# Patient Record
Sex: Female | Born: 1955 | Race: White | Hispanic: No | Marital: Married | Smoking: Never smoker
Health system: Southern US, Community
[De-identification: ages and names within clinical notes are randomized; demographics above are authoritative.]

## PROBLEM LIST (undated history)

## (undated) DIAGNOSIS — E7800 Pure hypercholesterolemia, unspecified: Secondary | ICD-10-CM

## (undated) DIAGNOSIS — E119 Type 2 diabetes mellitus without complications: Secondary | ICD-10-CM

## (undated) DIAGNOSIS — I639 Cerebral infarction, unspecified: Secondary | ICD-10-CM

## (undated) HISTORY — PX: NECK SURGERY: SHX720

## (undated) HISTORY — PX: TUBAL LIGATION: SHX77

## (undated) HISTORY — PX: ROTATOR CUFF REPAIR: SHX139

---

## 2016-03-06 ENCOUNTER — Inpatient Hospital Stay: Admit: 2016-03-06 | Payer: Self-pay | Admitting: Orthopedic Surgery

## 2016-03-06 ENCOUNTER — Encounter (HOSPITAL_BASED_OUTPATIENT_CLINIC_OR_DEPARTMENT_OTHER): Payer: Self-pay | Admitting: *Deleted

## 2016-03-06 ENCOUNTER — Ambulatory Visit (HOSPITAL_BASED_OUTPATIENT_CLINIC_OR_DEPARTMENT_OTHER)
Admission: EM | Admit: 2016-03-06 | Discharge: 2016-03-07 | Disposition: A | Discharge status: Home / Self Care | Payer: Worker's Compensation | Attending: Emergency Medicine | Admitting: Emergency Medicine

## 2016-03-06 ENCOUNTER — Encounter (HOSPITAL_COMMUNITY)
Admission: EM | Disposition: A | Discharge status: Home / Self Care | Payer: Self-pay | Source: Home / Self Care | Attending: Emergency Medicine

## 2016-03-06 ENCOUNTER — Emergency Department (HOSPITAL_COMMUNITY): Payer: Worker's Compensation | Admitting: Anesthesiology

## 2016-03-06 ENCOUNTER — Emergency Department (HOSPITAL_BASED_OUTPATIENT_CLINIC_OR_DEPARTMENT_OTHER): Payer: Worker's Compensation

## 2016-03-06 ENCOUNTER — Emergency Department (HOSPITAL_COMMUNITY): Payer: Worker's Compensation

## 2016-03-06 DIAGNOSIS — W312XXA Contact with powered woodworking and forming machines, initial encounter: Secondary | ICD-10-CM | POA: Insufficient documentation

## 2016-03-06 DIAGNOSIS — E119 Type 2 diabetes mellitus without complications: Secondary | ICD-10-CM | POA: Diagnosis not present

## 2016-03-06 DIAGNOSIS — S68125A Partial traumatic metacarpophalangeal amputation of left ring finger, initial encounter: Secondary | ICD-10-CM | POA: Insufficient documentation

## 2016-03-06 DIAGNOSIS — S68123A Partial traumatic metacarpophalangeal amputation of left middle finger, initial encounter: Secondary | ICD-10-CM | POA: Insufficient documentation

## 2016-03-06 DIAGNOSIS — Z7984 Long term (current) use of oral hypoglycemic drugs: Secondary | ICD-10-CM | POA: Diagnosis not present

## 2016-03-06 DIAGNOSIS — E7800 Pure hypercholesterolemia, unspecified: Secondary | ICD-10-CM | POA: Diagnosis not present

## 2016-03-06 DIAGNOSIS — Y9389 Activity, other specified: Secondary | ICD-10-CM | POA: Insufficient documentation

## 2016-03-06 DIAGNOSIS — Z23 Encounter for immunization: Secondary | ICD-10-CM | POA: Diagnosis not present

## 2016-03-06 DIAGNOSIS — Z8673 Personal history of transient ischemic attack (TIA), and cerebral infarction without residual deficits: Secondary | ICD-10-CM | POA: Diagnosis not present

## 2016-03-06 DIAGNOSIS — Z79899 Other long term (current) drug therapy: Secondary | ICD-10-CM | POA: Insufficient documentation

## 2016-03-06 DIAGNOSIS — Y9289 Other specified places as the place of occurrence of the external cause: Secondary | ICD-10-CM | POA: Insufficient documentation

## 2016-03-06 DIAGNOSIS — I10 Essential (primary) hypertension: Secondary | ICD-10-CM | POA: Insufficient documentation

## 2016-03-06 DIAGNOSIS — S68111A Complete traumatic metacarpophalangeal amputation of left index finger, initial encounter: Secondary | ICD-10-CM | POA: Insufficient documentation

## 2016-03-06 DIAGNOSIS — Y990 Civilian activity done for income or pay: Secondary | ICD-10-CM | POA: Diagnosis not present

## 2016-03-06 DIAGNOSIS — S61402A Unspecified open wound of left hand, initial encounter: Secondary | ICD-10-CM

## 2016-03-06 DIAGNOSIS — S68113A Complete traumatic metacarpophalangeal amputation of left middle finger, initial encounter: Secondary | ICD-10-CM

## 2016-03-06 HISTORY — DX: Pure hypercholesterolemia, unspecified: E78.00

## 2016-03-06 HISTORY — DX: Cerebral infarction, unspecified: I63.9

## 2016-03-06 HISTORY — PX: AMPUTATION: SHX166

## 2016-03-06 HISTORY — DX: Type 2 diabetes mellitus without complications: E11.9

## 2016-03-06 LAB — BASIC METABOLIC PANEL
ANION GAP: 5 (ref 5–15)
BUN: 11 mg/dL (ref 6–20)
CO2: 25 mmol/L (ref 22–32)
Calcium: 8.9 mg/dL (ref 8.9–10.3)
Chloride: 111 mmol/L (ref 101–111)
Creatinine, Ser: 0.79 mg/dL (ref 0.44–1.00)
GFR calc Af Amer: >60 mL/min (ref >60)
Glucose, Bld: 114 mg/dL — ABNORMAL HIGH (ref 65–99)
POTASSIUM: 4.4 mmol/L (ref 3.5–5.1)
SODIUM: 141 mmol/L (ref 135–145)

## 2016-03-06 LAB — CBC
HCT: 33.5 % — ABNORMAL LOW (ref 36.0–46.0)
Hemoglobin: 10.7 g/dL — ABNORMAL LOW (ref 12.0–15.0)
MCH: 29.7 pg (ref 26.0–34.0)
MCHC: 31.9 g/dL (ref 30.0–36.0)
MCV: 93.1 fL (ref 78.0–100.0)
PLATELETS: 188 10*3/uL (ref 150–400)
RBC: 3.6 MIL/uL — AB (ref 3.87–5.11)
RDW: 12.4 % (ref 11.5–15.5)
WBC: 5.3 10*3/uL (ref 4.0–10.5)

## 2016-03-06 LAB — GLUCOSE, CAPILLARY
Glucose-Capillary: 114 mg/dL — ABNORMAL HIGH (ref 65–99)
Glucose-Capillary: 103 mg/dL — ABNORMAL HIGH (ref 65–99)
Glucose-Capillary: 196 mg/dL — ABNORMAL HIGH (ref 65–99)

## 2016-03-06 SURGERY — AMPUTATION DIGIT
Anesthesia: General | Site: Hand | Laterality: Left

## 2016-03-06 MED ORDER — FENTANYL CITRATE (PF) 250 MCG/5ML IJ SOLN
INTRAMUSCULAR | Status: AC
  Filled 2016-03-06: qty 5

## 2016-03-06 MED ORDER — PROPOFOL 10 MG/ML IV BOLUS
INTRAVENOUS | Status: DC | PRN
  Administered 2016-03-06: 150 mg via INTRAVENOUS

## 2016-03-06 MED ORDER — DOCUSATE SODIUM 100 MG PO CAPS
100.0000 mg | ORAL_CAPSULE | Freq: Two times a day (BID) | ORAL | Status: DC
Start: 2016-03-06 — End: 2016-03-07
  Administered 2016-03-06 – 2016-03-07 (×2): 100 mg via ORAL
  Filled 2016-03-06 (×2): qty 1

## 2016-03-06 MED ORDER — BUPIVACAINE HCL (PF) 0.25 % IJ SOLN
INTRAMUSCULAR | Status: DC | PRN
  Administered 2016-03-06: 10 mL

## 2016-03-06 MED ORDER — PROMETHAZINE HCL 25 MG/ML IJ SOLN: 6.2500 mg | INTRAMUSCULAR | Status: DC | PRN

## 2016-03-06 MED ORDER — DIPHENHYDRAMINE HCL 25 MG PO CAPS: 25.0000 mg | ORAL_CAPSULE | Freq: Four times a day (QID) | ORAL | Status: DC | PRN

## 2016-03-06 MED ORDER — HYDROMORPHONE HCL 1 MG/ML IJ SOLN: 0.5000 mg | INTRAMUSCULAR | Status: DC | PRN

## 2016-03-06 MED ORDER — FENTANYL CITRATE (PF) 100 MCG/2ML IJ SOLN
INTRAMUSCULAR | Status: DC | PRN
  Administered 2016-03-06 (×3): 50 ug via INTRAVENOUS

## 2016-03-06 MED ORDER — CHLORHEXIDINE GLUCONATE 4 % EX LIQD: 60.0000 mL | Freq: Once | CUTANEOUS | Status: DC

## 2016-03-06 MED ORDER — DOCUSATE SODIUM 100 MG PO CAPS: 100.0000 mg | ORAL_CAPSULE | Freq: Two times a day (BID) | ORAL | 0 refills | Status: AC

## 2016-03-06 MED ORDER — CEFAZOLIN IN D5W 1 GM/50ML IV SOLN
1.0000 g | Freq: Three times a day (TID) | INTRAVENOUS | Status: DC
  Administered 2016-03-07 (×3): 1 g via INTRAVENOUS
  Filled 2016-03-06 (×5): qty 50

## 2016-03-06 MED ORDER — METHOCARBAMOL 500 MG PO TABS: 500.0000 mg | ORAL_TABLET | Freq: Four times a day (QID) | ORAL | Status: DC | PRN

## 2016-03-06 MED ORDER — ONDANSETRON HCL 4 MG PO TABS: 4.0000 mg | ORAL_TABLET | Freq: Four times a day (QID) | ORAL | Status: DC | PRN

## 2016-03-06 MED ORDER — LIDOCAINE 2% (20 MG/ML) 5 ML SYRINGE
INTRAMUSCULAR | Status: AC
  Filled 2016-03-06: qty 5

## 2016-03-06 MED ORDER — LIDOCAINE HCL (CARDIAC) 20 MG/ML IV SOLN
INTRAVENOUS | Status: DC | PRN
  Administered 2016-03-06: 60 mg via INTRAVENOUS

## 2016-03-06 MED ORDER — SODIUM CHLORIDE 0.9 % IR SOLN
Status: DC | PRN
  Administered 2016-03-06: 3000 mL

## 2016-03-06 MED ORDER — MEPERIDINE HCL 25 MG/ML IJ SOLN: 6.2500 mg | INTRAMUSCULAR | Status: DC | PRN

## 2016-03-06 MED ORDER — CEFAZOLIN SODIUM-DEXTROSE 2-4 GM/100ML-% IV SOLN
2.0000 g | INTRAVENOUS | Status: AC
  Filled 2016-03-06: qty 100

## 2016-03-06 MED ORDER — MIDAZOLAM HCL 2 MG/2ML IJ SOLN
INTRAMUSCULAR | Status: AC
  Filled 2016-03-06: qty 2

## 2016-03-06 MED ORDER — MORPHINE SULFATE (PF) 4 MG/ML IV SOLN: 4.0000 mg | INTRAVENOUS | Status: DC | PRN

## 2016-03-06 MED ORDER — BUPIVACAINE HCL (PF) 0.25 % IJ SOLN
INTRAMUSCULAR | Status: AC
  Filled 2016-03-06: qty 30

## 2016-03-06 MED ORDER — HYDROMORPHONE HCL 1 MG/ML IJ SOLN
INTRAMUSCULAR | Status: AC
  Filled 2016-03-06: qty 1

## 2016-03-06 MED ORDER — ONDANSETRON HCL 4 MG/2ML IJ SOLN: 4.0000 mg | Freq: Four times a day (QID) | INTRAMUSCULAR | Status: DC | PRN

## 2016-03-06 MED ORDER — EPHEDRINE SULFATE 50 MG/ML IJ SOLN
INTRAMUSCULAR | Status: DC | PRN
  Administered 2016-03-06: 20 mg via INTRAVENOUS

## 2016-03-06 MED ORDER — ENALAPRIL MALEATE 2.5 MG PO TABS
2.5000 mg | ORAL_TABLET | Freq: Every day | ORAL | Status: DC
  Administered 2016-03-07: 2.5 mg via ORAL
  Filled 2016-03-06 (×2): qty 1

## 2016-03-06 MED ORDER — HYDROCODONE-ACETAMINOPHEN 7.5-325 MG PO TABS
1.0000 | ORAL_TABLET | ORAL | Status: DC | PRN
  Administered 2016-03-06 – 2016-03-07 (×3): 1 via ORAL
  Filled 2016-03-06 (×3): qty 1

## 2016-03-06 MED ORDER — LACTATED RINGERS IV SOLN: INTRAVENOUS | Status: DC

## 2016-03-06 MED ORDER — TETANUS-DIPHTH-ACELL PERTUSSIS 5-2.5-18.5 LF-MCG/0.5 IM SUSP
0.5000 mL | Freq: Once | INTRAMUSCULAR | Status: AC
  Administered 2016-03-06: 0.5 mL via INTRAMUSCULAR
  Filled 2016-03-06: qty 0.5

## 2016-03-06 MED ORDER — LACTATED RINGERS IV SOLN
INTRAVENOUS | Status: DC | PRN
  Administered 2016-03-06: 17:00:00 via INTRAVENOUS

## 2016-03-06 MED ORDER — OXYCODONE-ACETAMINOPHEN 5-325 MG PO TABS
1.0000 | ORAL_TABLET | ORAL | Status: DC | PRN
  Administered 2016-03-07: 1 via ORAL
  Filled 2016-03-06: qty 1

## 2016-03-06 MED ORDER — CEPHALEXIN 500 MG PO CAPS: 500.0000 mg | ORAL_CAPSULE | Freq: Four times a day (QID) | ORAL | 0 refills | Status: AC

## 2016-03-06 MED ORDER — ONDANSETRON HCL 4 MG/2ML IJ SOLN
INTRAMUSCULAR | Status: DC | PRN
Start: 2016-03-06 — End: 2016-03-06
  Administered 2016-03-06: 4 mg via INTRAVENOUS

## 2016-03-06 MED ORDER — MIDAZOLAM HCL 5 MG/5ML IJ SOLN
INTRAMUSCULAR | Status: DC | PRN
  Administered 2016-03-06: 2 mg via INTRAVENOUS

## 2016-03-06 MED ORDER — KCL IN DEXTROSE-NACL 20-5-0.45 MEQ/L-%-% IV SOLN
INTRAVENOUS | Status: AC
  Filled 2016-03-06: qty 1000

## 2016-03-06 MED ORDER — METFORMIN HCL 500 MG PO TABS
1000.0000 mg | ORAL_TABLET | Freq: Two times a day (BID) | ORAL | Status: DC
  Administered 2016-03-07: 1000 mg via ORAL
  Filled 2016-03-06: qty 2

## 2016-03-06 MED ORDER — PROPOFOL 10 MG/ML IV BOLUS
INTRAVENOUS | Status: AC
  Filled 2016-03-06: qty 20

## 2016-03-06 MED ORDER — VITAMIN C 500 MG PO TABS
1000.0000 mg | ORAL_TABLET | Freq: Every day | ORAL | Status: DC
  Administered 2016-03-06 – 2016-03-07 (×2): 1000 mg via ORAL
  Filled 2016-03-06 (×2): qty 2

## 2016-03-06 MED ORDER — ONDANSETRON HCL 4 MG/2ML IJ SOLN
INTRAMUSCULAR | Status: AC
  Filled 2016-03-06: qty 2

## 2016-03-06 MED ORDER — GENTAMICIN SULFATE 40 MG/ML IJ SOLN
390.0000 mg | INTRAVENOUS | Status: AC
  Administered 2016-03-06: 390 mg via INTRAVENOUS
  Filled 2016-03-06: qty 9.75

## 2016-03-06 MED ORDER — KCL IN DEXTROSE-NACL 20-5-0.45 MEQ/L-%-% IV SOLN
INTRAVENOUS | Status: DC
  Administered 2016-03-06: 19:00:00 via INTRAVENOUS

## 2016-03-06 MED ORDER — HYDROMORPHONE HCL 1 MG/ML IJ SOLN
0.2500 mg | INTRAMUSCULAR | Status: DC | PRN
  Administered 2016-03-06: 0.5 mg via INTRAVENOUS

## 2016-03-06 MED ORDER — CEFAZOLIN IN D5W 1 GM/50ML IV SOLN
1.0000 g | Freq: Once | INTRAVENOUS | Status: AC
  Administered 2016-03-06: 1 g via INTRAVENOUS
  Filled 2016-03-06: qty 50

## 2016-03-06 MED ORDER — ONDANSETRON HCL 4 MG/2ML IJ SOLN
4.0000 mg | Freq: Once | INTRAMUSCULAR | Status: AC
  Administered 2016-03-06: 4 mg via INTRAVENOUS
  Filled 2016-03-06: qty 2

## 2016-03-06 MED ORDER — MORPHINE SULFATE (PF) 4 MG/ML IV SOLN
4.0000 mg | Freq: Once | INTRAVENOUS | Status: AC
  Administered 2016-03-06: 4 mg via INTRAVENOUS
  Filled 2016-03-06: qty 1

## 2016-03-06 MED ORDER — OXYCODONE-ACETAMINOPHEN 5-325 MG PO TABS: 1.0000 | ORAL_TABLET | ORAL | 0 refills | Status: AC | PRN

## 2016-03-06 MED ORDER — CEFAZOLIN IN D5W 1 GM/50ML IV SOLN
1.0000 g | INTRAVENOUS | Status: AC
  Administered 2016-03-06: 1 g via INTRAVENOUS
  Filled 2016-03-06: qty 50

## 2016-03-06 MED ORDER — ATORVASTATIN CALCIUM 40 MG PO TABS: 40.0000 mg | ORAL_TABLET | Freq: Every day | ORAL | Status: DC

## 2016-03-06 MED ORDER — METHOCARBAMOL 1000 MG/10ML IJ SOLN
500.0000 mg | Freq: Four times a day (QID) | INTRAVENOUS | Status: DC | PRN
  Filled 2016-03-06: qty 5

## 2016-03-06 MED ORDER — LACTATED RINGERS IV SOLN
INTRAVENOUS | Status: DC
  Administered 2016-03-06: 15:00:00 via INTRAVENOUS

## 2016-03-06 MED ORDER — 0.9 % SODIUM CHLORIDE (POUR BTL) OPTIME
TOPICAL | Status: DC | PRN
  Administered 2016-03-06: 1000 mL

## 2016-03-06 SURGICAL SUPPLY — 50 items
BANDAGE ACE 4X5 VEL STRL LF (GAUZE/BANDAGES/DRESSINGS) ×2 IMPLANT
BANDAGE ELASTIC 3 VELCRO ST LF (GAUZE/BANDAGES/DRESSINGS) IMPLANT
BANDAGE ELASTIC 4 VELCRO ST LF (GAUZE/BANDAGES/DRESSINGS) IMPLANT
BLADE 15 SAFETY STRL DISP (BLADE) ×6 IMPLANT
BLADE SAW SGTL 83.5X18.5 (BLADE) ×2 IMPLANT
BNDG COHESIVE 1X5 TAN STRL LF (GAUZE/BANDAGES/DRESSINGS) ×2 IMPLANT
BNDG CONFORM 2 STRL LF (GAUZE/BANDAGES/DRESSINGS) IMPLANT
BNDG ELASTIC 2X5.8 VLCR STR LF (GAUZE/BANDAGES/DRESSINGS) ×2 IMPLANT
BNDG GAUZE ELAST 4 BULKY (GAUZE/BANDAGES/DRESSINGS) ×4 IMPLANT
CORDS BIPOLAR (ELECTRODE) ×2 IMPLANT
COVER SURGICAL LIGHT HANDLE (MISCELLANEOUS) ×2 IMPLANT
CUFF TOURNIQUET SINGLE 18IN (TOURNIQUET CUFF) ×2 IMPLANT
CUFF TOURNIQUET SINGLE 24IN (TOURNIQUET CUFF) IMPLANT
DRAPE SURG 17X23 STRL (DRAPES) ×2 IMPLANT
DRSG ADAPTIC 3X8 NADH LF (GAUZE/BANDAGES/DRESSINGS) ×2 IMPLANT
DRSG PAD ABDOMINAL 8X10 ST (GAUZE/BANDAGES/DRESSINGS) ×2 IMPLANT
GAUZE SPONGE 2X2 8PLY STRL LF (GAUZE/BANDAGES/DRESSINGS) IMPLANT
GAUZE SPONGE 4X4 12PLY STRL (GAUZE/BANDAGES/DRESSINGS) ×2 IMPLANT
GLOVE BIOGEL PI IND STRL 8.5 (GLOVE) ×1 IMPLANT
GLOVE BIOGEL PI INDICATOR 8.5 (GLOVE) ×1
GLOVE SURG ORTHO 8.0 STRL STRW (GLOVE) ×4 IMPLANT
GOWN STRL REUS W/ TWL LRG LVL3 (GOWN DISPOSABLE) ×1 IMPLANT
GOWN STRL REUS W/ TWL XL LVL3 (GOWN DISPOSABLE) ×1 IMPLANT
GOWN STRL REUS W/TWL LRG LVL3 (GOWN DISPOSABLE) ×1
GOWN STRL REUS W/TWL XL LVL3 (GOWN DISPOSABLE) ×1
KIT BASIN OR (CUSTOM PROCEDURE TRAY) ×2 IMPLANT
KIT ROOM TURNOVER OR (KITS) ×2 IMPLANT
MANIFOLD NEPTUNE II (INSTRUMENTS) ×2 IMPLANT
NEEDLE HYPO 25GX1X1/2 BEV (NEEDLE) ×2 IMPLANT
NS IRRIG 1000ML POUR BTL (IV SOLUTION) ×2 IMPLANT
PACK ORTHO EXTREMITY (CUSTOM PROCEDURE TRAY) ×2 IMPLANT
PAD ARMBOARD 7.5X6 YLW CONV (MISCELLANEOUS) ×4 IMPLANT
PAD CAST 4YDX4 CTTN HI CHSV (CAST SUPPLIES) ×1 IMPLANT
PADDING CAST COTTON 4X4 STRL (CAST SUPPLIES) ×1
SOAP 2 % CHG 4 OZ (WOUND CARE) ×2 IMPLANT
SPECIMEN JAR SMALL (MISCELLANEOUS) ×2 IMPLANT
SPLINT FIBERGLASS 3X12 (CAST SUPPLIES) ×2 IMPLANT
SPONGE GAUZE 2X2 STER 10/PKG (GAUZE/BANDAGES/DRESSINGS)
SPONGE GAUZE 4X4 12PLY STER LF (GAUZE/BANDAGES/DRESSINGS) ×2 IMPLANT
SUCTION FRAZIER HANDLE 10FR (MISCELLANEOUS)
SUCTION TUBE FRAZIER 10FR DISP (MISCELLANEOUS) IMPLANT
SUT ETHIBOND 4 0 TF (SUTURE) ×2 IMPLANT
SUT MERSILENE 4 0 P 3 (SUTURE) IMPLANT
SUT PROLENE 4 0 PS 2 18 (SUTURE) ×12 IMPLANT
SYR CONTROL 10ML LL (SYRINGE) ×2 IMPLANT
SYSTEM CHEST DRAIN TLS 7FR (DRAIN) ×2 IMPLANT
TOWEL OR 17X24 6PK STRL BLUE (TOWEL DISPOSABLE) ×2 IMPLANT
TOWEL OR 17X26 10 PK STRL BLUE (TOWEL DISPOSABLE) ×2 IMPLANT
TUBE CONNECTING 12X1/4 (SUCTIONS) IMPLANT
WATER STERILE IRR 1000ML POUR (IV SOLUTION) ×2 IMPLANT

## 2016-03-06 NOTE — ED Notes (Signed)
Attempt x 2 to start IV to right arm. Unsuccessful.

## 2016-03-06 NOTE — ED Provider Notes (Signed)
Patient arrives from [facility] to see Dr. Melvyn Novas for amputations of fingers of left hand.  She states her pain is controlled currently.  She already received a tetanus vaccine.  Will page Dr. Melvyn Novas and notify of patient's arrival.  1:54 PM Spoke with Dr. Melvyn Novas.  Placing orders to take to OR.  Requested ancef and gent which has been ordered.  Will remain NPO.  3:08 PM Patient up to OR at this time.   Garlon Hatchet, PA-C 03/06/16 1508    Azalia Bilis, MD 03/06/16 2300

## 2016-03-06 NOTE — Anesthesia Preprocedure Evaluation (Addendum)
Anesthesia Evaluation  Patient identified by MRN, date of birth, ID band Patient awake    Reviewed: Allergy & Precautions, NPO status , Patient's Chart, lab work & pertinent test results  Airway Mallampati: I  TM Distance: >3 FB Neck ROM: Full    Dental  (+) Edentulous Upper, Edentulous Lower   Pulmonary neg pulmonary ROS,    breath sounds clear to auscultation       Cardiovascular hypertension, Pt. on medications negative cardio ROS   Rhythm:Regular Rate:Normal     Neuro/Psych CVA negative psych ROS   GI/Hepatic negative GI ROS, Neg liver ROS,   Endo/Other  diabetes, Type 2, Oral Hypoglycemic Agents  Renal/GU negative Renal ROS  negative genitourinary   Musculoskeletal negative musculoskeletal ROS (+)   Abdominal   Peds negative pediatric ROS (+)  Hematology negative hematology ROS (+)   Anesthesia Other Findings - HLD  Reproductive/Obstetrics negative OB ROS                            Lab Results  Component Value Date   WBC 5.3 03/06/2016   HGB 10.7 (L) 03/06/2016   HCT 33.5 (L) 03/06/2016   MCV 93.1 03/06/2016   PLT 188 03/06/2016   No results found for: CREATININE, BUN, NA, K, CL, CO2 No results found for: INR, PROTIME  02/2016 EKG: normal EKG, normal sinus rhythm.   Anesthesia Physical Anesthesia Plan  ASA: II and emergent  Anesthesia Plan: General   Post-op Pain Management:    Induction: Intravenous  Airway Management Planned: Oral ETT  Additional Equipment:   Intra-op Plan:   Post-operative Plan: Extubation in OR  Informed Consent: I have reviewed the patients History and Physical, chart, labs and discussed the procedure including the risks, benefits and alternatives for the proposed anesthesia with the patient or authorized representative who has indicated his/her understanding and acceptance.   Dental advisory given  Plan Discussed with:  CRNA  Anesthesia Plan Comments: (Ms. Denholm refused nerve block. She states "pain is not that bad")       Anesthesia Quick Evaluation

## 2016-03-06 NOTE — ED Notes (Signed)
[facility] here to transport pt to [facility] ED.

## 2016-03-06 NOTE — ED Notes (Signed)
Pt returned from xray

## 2016-03-06 NOTE — ED Provider Notes (Signed)
EKG Interpretation  Date/Time:  Wednesday March 06 2016 14:31:26 EDT Ventricular Rate:  62 PR Interval:    QRS Duration: 80 QT Interval:  429 QTC Calculation: 436 R Axis:   12 Text Interpretation:  Sinus rhythm Low voltage, precordial leads No old tracing to compare Confirmed by Halley Kincer  MD, Caryn Bee ([id number]) on 03/06/2016 2:34:13 PM      Preoperative labs, EKG, chest x-ray obtained.  Tetanus RE updated.  Ancef given.  Patient comfortable this time.  Pain medications offered.  Patient will remain nothing by mouth in preparation for operative repair by orthopedic hand surgery   Azalia Bilis, MD 03/06/16 1434

## 2016-03-06 NOTE — Transfer of Care (Signed)
Immediate Anesthesia Transfer of Care Note  Patient: Mary Tran  Procedure(s) Performed: Procedure(s): LEFT INDEX FINGER AND LONG FINGER REVISION OF AMPUTATION (Left)  Patient Location: PACU  Anesthesia Type:General  Level of Consciousness: awake  Airway & Oxygen Therapy: Patient Spontanous Breathing and Patient connected to nasal cannula oxygen  Post-op Assessment: Report given to RN and Post -op Vital signs reviewed and stable  Post vital signs: Reviewed and stable  Last Vitals:  Vitals:   03/06/16 1200 03/06/16 1312  BP: 119/66 133/55  Pulse: 64 68  Resp:  17  Temp:  36.4 C    Last Pain:  Vitals:   03/06/16 1313  TempSrc:   PainSc: 3          Complications: No apparent anesthesia complications

## 2016-03-06 NOTE — ED Notes (Signed)
Patient arrived via [facility] already in gown, placed on continuous pulse oximetry and blood pressure cuff

## 2016-03-06 NOTE — ED Notes (Signed)
Patient transported to X-ray 

## 2016-03-06 NOTE — Brief Op Note (Signed)
03/06/2016  4:48 PM  PATIENT:  Mary Tran  60 y.o. female  PRE-OPERATIVE DIAGNOSIS:  Amputation of finger  POST-OPERATIVE DIAGNOSIS:  Amputation of finger  PROCEDURE:  Procedure(s): LEFT INDEX FINGER AND LONG FINGER REVISION OF AMPUTATION (Left)  SURGEON:  Surgeon(s) and Role:    * Bradly Bienenstock, MD - Primary  PHYSICIAN ASSISTANT:   ASSISTANTS: none   ANESTHESIA:   general  EBL:  No intake/output data recorded.  BLOOD ADMINISTERED:none  DRAINS: none   LOCAL MEDICATIONS USED:  MARCAINE     SPECIMEN:  No Specimen  DISPOSITION OF SPECIMEN:  N/A  COUNTS:  YES  TOURNIQUET:    DICTATION: .Other Dictation: Dictation Number [id number]  PLAN OF CARE: Admit for overnight observation  PATIENT DISPOSITION:  PACU - hemodynamically stable.   Delay start of Pharmacological VTE agent (>24hrs) due to surgical blood loss or risk of bleeding: not applicable [id number]

## 2016-03-06 NOTE — ED Notes (Signed)
Pt wound unwrapped per order of Dr Particia Nearing to take a picture for trauma surgeon. Bleeding controlled.  Hand rewrapped with saline soaked gauge and secured with kerlex.

## 2016-03-06 NOTE — ED Notes (Signed)
Report given to Administrator, Civil Service at [facility] ED.

## 2016-03-06 NOTE — ED Triage Notes (Signed)
Pt transferred via [facility] from [facility] for hand surgery consult. Around 0850 this morning pt was using a sander and got her left hand caught in the machine amputating her left index and left middle finger. Dressing dry and intact at this time. Pt has feeling in the other fingers of the left hand and cap refill is less than three seconds. Radial pulses equal, strong bilaterally. Pt reports pain 3/10 at this time.

## 2016-03-06 NOTE — ED Notes (Signed)
Pt wound cleaned with sterile saline. Saline soaked gauze applied to wound with bulky kerlex drsg to secure it. Pt tolerated well.

## 2016-03-06 NOTE — H&P (Signed)
Mary Tran is an 60 y.o. female.   Chief Complaint: Left hand index/long finger amputations HPI: Pt at worked sustained degloving injury to the left hand Pt was at work today and her L hand became caught in a sander.  She has an amputation of her left index and middle finger.  There is a partial degloving of the dorsum of her left hand.  Pt is able to feel and move her thumb, ring and small finger.  Pt is right hand dominant.  Past Medical History:  Diagnosis Date  . Diabetes mellitus without complication (HCC)   . Hypercholesteremia   . Stroke [facility])     Past Surgical History:  Procedure Laterality Date  . NECK SURGERY    . ROTATOR CUFF REPAIR    . TUBAL LIGATION      History reviewed. No pertinent family history. Social History:  reports that she has never smoked. She has never used smokeless tobacco. She reports that she does not drink alcohol or use drugs.  Allergies: No Known Allergies    No results found for this or any previous visit (from the past 48 hour(s)). Dg Hand Complete Left  Result Date: 03/06/2016 CLINICAL DATA:  Injury. EXAM: LEFT HAND - COMPLETE 3+ VIEW COMPARISON:  No prior. FINDINGS: Amputation of the left second and third digits noted. Amputation is at the level of the metacarpal phalangeal joints. Comminuted fracture fragments are noted. Tiny metallic soft tissue foreign bodies cannot be exclude. Diffuse degenerative change. IMPRESSION: Amputation of the left second and third digits as described above. Electronically Signed   By: Maisie Fus  Register   On: 03/06/2016 09:39   ROS:NO RECENT ILLNESSES OR HOSPITALIZATIONS  Blood pressure 133/55, pulse 68, temperature 97.6 F (36.4 C), temperature source Oral, resp. rate 17, height 5\' 5"  (1.651 m), weight 77.1 kg (170 lb), SpO2 99 %. Physical Exam  General Appearance:  Alert, cooperative, no distress, appears stated age  Head:  Normocephalic, without obvious abnormality, atraumatic  Eyes:  Pupils equal,  conjunctiva/corneas clear,         Throat: Lips, mucosa, and tongue normal; teeth and gums normal  Neck: No visible masses     Lungs:   respirations unlabored  Chest Wall:  No tenderness or deformity  Heart:  Regular rate and rhythm,  Abdomen:   Soft, non-tender,         Extremities: LUE: DRESSING IN PLACE, IMAGES REVIEWED GOOD THUMB AND RING/SMALL FINGER MOTION ABLE TO FLEX AND EXTEND WRIST GOOD BLOOD FLOW TO RING AND SMALL AND THUMB  Pulses: 2+ and symmetric  Skin: Skin color, texture, turgor normal, no rashes or lesions     Neurologic: Normal    Assessment/Plan LEFT INDEX AND LONG FINGER DEGLOVING AMPUTATIONS THROUGH MP JOINT  PT WITH NON-REPLANTABLE DIGITS, ONLY HAS ONE FINGER WITH HER. AVULSION TYPE INJURY WITH +RIBBON SIGNS TO TENDON AND SOFT TISSUE INJURY TO FINGER BROUGHT IN. POOR VOLAR AND DORSAL VENOUS PLEXUS FOR REPLANTATION  PT UNDERSTANDS REASON AND RATIONALE FOR REVISION AMPUTATION AND WOUND CLOSURE AND DEBRIDEMENT  R/B/A DISCUSSED WITH PT IN HOSPITAL.  PT VOICED UNDERSTANDING OF PLAN CONSENT SIGNED DAY OF SURGERY PT SEEN AND EXAMINED PRIOR TO OPERATIVE PROCEDURE/DAY OF SURGERY SITE MARKED. QUESTIONS ANSWERED WILL BE ADMITTED FOLLOWING SURGERY  WE ARE PLANNING SURGERY FOR YOUR UPPER EXTREMITY. THE RISKS AND BENEFITS OF SURGERY INCLUDE BUT NOT LIMITED TO BLEEDING INFECTION, DAMAGE TO NEARBY NERVES ARTERIES TENDONS, FAILURE OF SURGERY TO ACCOMPLISH ITS INTENDED GOALS, PERSISTENT SYMPTOMS AND NEED FOR FURTHER  SURGICAL INTERVENTION. WITH THIS IN MIND WE WILL PROCEED. I HAVE DISCUSSED WITH THE PATIENT THE PRE AND POSTOPERATIVE REGIMEN AND THE DOS AND DON'TS. PT VOICED UNDERSTANDING AND INFORMED CONSENT SIGNED.  Sharma Covert 03/06/2016, 2:21 PM

## 2016-03-06 NOTE — ED Triage Notes (Signed)
Pt states she got left hand caught in sander. Pt is missing index and middle finger of left hand and degloving of back of hand. NL pulses. Bleeding controlled.

## 2016-03-06 NOTE — ED Notes (Signed)
Son called per pt request. Lexine Baton [phone]. Updated on pt condition. Gave son phone number and address to ED.

## 2016-03-06 NOTE — ED Notes (Signed)
Report given to Paula RN with [facility]. 

## 2016-03-06 NOTE — ED Provider Notes (Signed)
[facility] Provider Note   CSN: [id number] Arrival date & time: 03/06/16  9983  First Provider Contact:  First MD Initiated Contact with Patient 03/06/16 0848        History   Chief Complaint Chief Complaint  Patient presents with  . Hand Injury    HPI Mary Tran is a 60 y.o. female.  Pt was at work today and her L hand became caught in a sander.  She has an amputation of her left index and middle finger.  There is a partial degloving of the dorsum of her left hand.  Pt is able to feel and move her thumb, ring and small finger.  Pt is right hand dominant.   The history is provided by the patient.    Past Medical History:  Diagnosis Date  . Diabetes mellitus without complication (HCC)   . Hypercholesteremia   . Stroke [facility])     There are no active problems to display for this patient.   Past Surgical History:  Procedure Laterality Date  . NECK SURGERY    . ROTATOR CUFF REPAIR    . TUBAL LIGATION      OB History    No data available       Home Medications    Prior to Admission medications   Medication Sig Start Date End Date Taking? Authorizing Provider  metFORMIN (GLUCOPHAGE) 1000 MG tablet Take 1,000 mg by mouth 2 (two) times daily with a meal.   Yes Historical Provider, MD  simvastatin (ZOCOR) 80 MG tablet Take 80 mg by mouth daily.   Yes Historical Provider, MD    Family History No family history on file.  Social History Social History  Substance Use Topics  . Smoking status: Never Smoker  . Smokeless tobacco: Never Used  . Alcohol use Not on file     Allergies   Review of patient's allergies indicates no known allergies.   Review of Systems Review of Systems  Musculoskeletal:       Right hand pain  All other systems reviewed and are negative.    Physical Exam Updated Vital Signs BP 120/66   Pulse 69   Temp 98.5 F (36.9 C) (Oral)   Resp 18   Ht 5\' 5"  (1.651 m)   Wt 170 lb (77.1 kg)   SpO2 100%   BMI 28.29  kg/m   Physical Exam  Constitutional: She is oriented to person, place, and time. She appears well-developed and well-nourished.  HENT:  Head: Normocephalic and atraumatic.  Right Ear: External ear normal.  Left Ear: External ear normal.  Nose: Nose normal.  Mouth/Throat: Oropharynx is clear and moist.  Eyes: Conjunctivae and EOM are normal. Pupils are equal, round, and reactive to light.  Neck: Normal range of motion. Neck supple.  Cardiovascular: Normal rate, regular rhythm, normal heart sounds and intact distal pulses.   Pulmonary/Chest: Effort normal and breath sounds normal.  Abdominal: Soft. Bowel sounds are normal.  Musculoskeletal:  Traumatic amputation of index and middle finger.  Skin on dorsum of hand partially gone.  Good movement of thumb and ring/small finger.  Palmar surface of hand looks ok.  Neurological: She is alert and oriented to person, place, and time.  Skin: Skin is warm and dry.  Psychiatric: She has a normal mood and affect. Her behavior is normal. Judgment and thought content normal.  Nursing note and vitals reviewed.    ED Treatments / Results  Labs (all labs ordered are listed, but only  abnormal results are displayed) Labs Reviewed - No data to display  EKG  EKG Interpretation None       Radiology Dg Hand Complete Left  Result Date: 03/06/2016 CLINICAL DATA:  Injury. EXAM: LEFT HAND - COMPLETE 3+ VIEW COMPARISON:  No prior. FINDINGS: Amputation of the left second and third digits noted. Amputation is at the level of the metacarpal phalangeal joints. Comminuted fracture fragments are noted. Tiny metallic soft tissue foreign bodies cannot be exclude. Diffuse degenerative change. IMPRESSION: Amputation of the left second and third digits as described above. Electronically Signed   By: Maisie Fus  Register   On: 03/06/2016 09:39   Procedures .Critical Care Performed by: Jacalyn Lefevre Authorized by: Jacalyn Lefevre   Critical care provider  statement:    Critical care time (minutes):  30   Critical care was necessary to treat or prevent imminent or life-threatening deterioration of the following conditions:  Trauma   Critical care was time spent personally by me on the following activities:  Development of treatment plan with patient or surrogate, discussions with consultants, examination of patient, ordering and performing treatments and interventions, ordering and review of radiographic studies and re-evaluation of patient's condition    (including critical care time)  Medications Ordered in ED Medications  ceFAZolin (ANCEF) IVPB 1 g/50 mL premix (0 g Intravenous Stopped 03/06/16 0940)  ondansetron (ZOFRAN) injection 4 mg (4 mg Intravenous Given 03/06/16 0925)  morphine 4 MG/ML injection 4 mg (4 mg Intravenous Given 03/06/16 0925)     Initial Impression / Assessment and Plan / ED Course  I have reviewed the triage vital signs and the nursing notes.  Pertinent labs & imaging results that were available during my care of the patient were reviewed by me and considered in my medical decision making (see chart for details).  Clinical Course  Pt's wedding rings were removed with ring cutters prior to x-ray. Initially, we did not have any of her fingers.  However, someone from her job brought 1 of her fingers.  We wrapped it in a saline soaked gauze and indirectly cooled.  Pt d/w Dr. Melvyn Novas (on call for hand) and he does not do reimplantations.  He recommended transfer to [facility].  Pt d/w Dr. Tamela Oddi (Plastics and on call for hand) and he said that the likelihood of a successful reimplantation is very low, and that our hand surgeon can take care of pt.  He would not accept pt unless our hand evaluated pt and called him.  Pt d/w Dr. Melvyn Novas again and asked for pictures.  I sent him pictures and he does not think the hand is suitable for reimplantation.  He said that he was willing to call the plastics dr at [facility] if pt was  wanting to try to go that route.  Pt decided that she does not and agrees to go to [facility].  Dr. Melvyn Novas requested that pt go to the ED at [facility].  I spoke with Dr. Juleen China (ED) who will call Dr. Melvyn Novas when pt arrives.   Final Clinical Impressions(s) / ED Diagnoses   Final diagnoses:  Traumatic amputation of second finger of left hand, initial encounter  Traumatic amputation of third finger of left hand, initial encounter  Degloving injury of left hand, initial encounter    New Prescriptions New Prescriptions   No medications on file     Jacalyn Lefevre, MD 03/08/16 1554

## 2016-03-06 NOTE — Discharge Instructions (Signed)
KEEP BANDAGE CLEAN AND DRY °CALL OFFICE FOR F/U APPT [phone] IN 7 DAYS °KEEP HAND ELEVATED ABOVE HEART °OK TO APPLY ICE TO OPERATIVE AREA °CONTACT OFFICE IF ANY WORSENING PAIN OR CONCERNS. °

## 2016-03-06 NOTE — Anesthesia Procedure Notes (Signed)
Procedure Name: LMA Insertion Date/Time: 03/06/2016 4:55 PM Performed by: Gavin Pound, Shalik Sanfilippo J Pre-anesthesia Checklist: Patient identified, Emergency Drugs available, Suction available, Patient being monitored and Timeout performed Patient Re-evaluated:Patient Re-evaluated prior to inductionOxygen Delivery Method: Circle system utilized Preoxygenation: Pre-oxygenation with 100% oxygen Intubation Type: IV induction Ventilation: Mask ventilation without difficulty LMA: LMA inserted LMA Size: 4.0 Number of attempts: 1 Placement Confirmation: positive ETCO2 and breath sounds checked- equal and bilateral Tube secured with: Tape Dental Injury: Teeth and Oropharynx as per pre-operative assessment

## 2016-03-07 ENCOUNTER — Encounter (HOSPITAL_COMMUNITY): Payer: Self-pay | Admitting: Orthopedic Surgery

## 2016-03-07 LAB — GLUCOSE, CAPILLARY: Glucose-Capillary: 144 mg/dL — ABNORMAL HIGH (ref 65–99)

## 2016-03-07 NOTE — Progress Notes (Signed)
Pt ready for discharge. Education/instructions reviewed with pt and family, and all questions/conerns addressed. IV removed and belongings gathered. Pt will be transported out via wheelchair to husband's car. Will continue to monitor

## 2016-03-07 NOTE — Op Note (Signed)
NAMEMarland Kitchen  KIMIYAH, BLICK NO.:  [id number]  MEDICAL RECORD NO.:  [id number]  LOCATION:  [id number]                        FACILITY:  [facility]  PHYSICIAN:  Sharma Covert IV, M.D.DATE OF BIRTH:  [birth year]  DATE OF PROCEDURE:  03/06/2016 DATE OF DISCHARGE:                              OPERATIVE REPORT   PREOPERATIVE DIAGNOSIS:  Traumatic left index finger, long finger, near amputation to the ring finger.  POSTOPERATIVE DIAGNOSIS:  Traumatic left index finger, long finger, near amputation to the ring finger.  ATTENDING PHYSICIANS:  Sharma Covert, M.D., who scrubbed and present for the entire procedure.  ASSISTANT SURGEON:  None.  ANESTHESIA:  General via LMA.  SURGICAL PROCEDURE: 1. Debridement of skin, subcutaneous tissue, and bone; left index     finger associated with open degloving injury. 2. Open debridement of skin, subcutaneous tissue, and bone; left long     finger associated with open degloving injury. 3. Open debridement of skin, subcutaneous tissue, and bone; left ring     finger associated with open degloving injury. 4. Left index finger revision amputation through the level of the     metacarpal, ray amputation. 5. Left long finger amputation through the level of the metacarpal,     ray amputation. 6. Open treatment of left ring finger articular fracture involving the     MP joint without internal fixation. 7. Open treatment of left ring finger extensor tendon laceration,     dorsum of the hand through the level of the MP joint. 8. Rotational flap closure greater than 10 squared cm on the dorsum of     the left hand.  SURGICAL INDICATIONS:  Mrs. Gotham is a right-hand dominant female, who sustained very significant injury to her left hand, right hand was pulled into a sanding instrument.  The patient has rather significant contaminated degloving injury to the fingers.  She lost 1 finger.  She brought in 1 finger it was not replantable.  The patient  has significant soft tissue injury over the dorsal aspect of the hand greater than 10 squared cm.  Risks, benefits, and alternatives were discussed in detail with the patient and signed informed consent was obtained.  Risks include, but not limited to bleeding; infection; damage to nearby nerves, arteries, or tendons; loss of motion of wrist and digits, incomplete relief of symptoms; need for further surgical intervention.  DESCRIPTION OF PROCEDURE:  The patient was properly identified in the preoperative holding area and marked with a permanent marker on left hand to indicate the correct operative site.  The patient was brought back to the operating room, placed supine on anesthesia table, where the general anesthetic was administered.  The patient received preoperative antibiotics prior to skin incision.  A well-padded tourniquet was then placed on the left brachium and sealed with 1000 drape.  Left upper extremity was then prepped and draped in normal sterile fashion.  Time- out was called, correct side was identified, and procedure then begun. Attention then turned to the dorsum of the hand, where the skin flaps were then raised.  Back cut was then made in order to elevate the skin flaps radially and ulnarly.  This exposed  the long finger and index finger metacarpal shaft.  The patient had significant soft tissue injuries and contamination of these 2 fingers.  Ray amputation was then completed, taken these back to healthy bony areas to the level of the metacarpal shaft.  Disarticulation was then carried out.  Removal of the rays were then done through the level of the metacarpal shaft.  The wound was then thoroughly irrigated.  Excisional debridement of skin and subcutaneous tissue and bone was then done circumferentially removing the through very thick contaminated tissue, this was done sharply with a knife, curettes, and rongeurs.  This was greater than 10 square cm area. Thorough  wound irrigation done throughout.  Local neurectomies were then carried out of the index and long finger common digital nerves and the digital nerves and arteries.  Local neurectomy was then carried out. The flexor tendons were allowed to retract proximally.  The wound was then thoroughly irrigated.  The patient did have soft tissue defect directly over the ring finger extensor mechanism.  Excisional debridement was then carried out of the skin and subcutaneous tissue all the way down to the bone.  The patient had the open fracture at the MP joint.  The cartilaginous loss of the MP joint and involvement of the joint.  The joint was then thoroughly irrigated.  Internal fixation was not required.  The extensor mechanism did have the shearing injury over the dorsal aspect of the extensor mechanism.  Excisional debridement of the tendon was then taken back, and the tendon was then repaired back to each other, side-to-side repair was then done with 4 Ethibond suture. This was done nicely reapproximating and closing the defect over the extensor mechanism.  The wound was irrigated, thorough wound irrigation done with a large defect.  The skin flaps were then advanced and was able to rotate the skin flaps greater than 10 square cm from radial to ulnar direction closing it very nicely over the dorsal aspect of the ring finger.  This was then closed with 4-0 Prolene suture, it was closed nicely without any significant contracture on the web space. Small #7 TLS drain was then used to prevent hematoma on the dorsum of the hand.  Thorough wound irrigation done throughout.  Once this was carried out, Adaptic dressing and sterile compressive bandage then applied.  The patient was then placed in a well-padded volar splint, extubated, and taken to recovery room in good condition.  POSTPROCEDURAL PLAN:  The patient will be admitted for IV antibiotics and pain control.  She will be discharged once her pain  is under control on oral antibiotics and plan to see her weekly intervals watching the wound and begin some gentle movement of the ring and small fingers and thumb.  This was a very significant injury with a soft tissue defect dorsally, loss of 2 fingers with significant impairment to the hand. Radiographs at the first visit.     Madelynn Done, M.D.     FWO/MEDQ  D:  03/06/2016  T:  03/07/2016  Job:  [id number]

## 2016-03-07 NOTE — Discharge Summary (Signed)
Physician Discharge Summary  Patient ID: Mary Tran MRN: [id number] DOB/AGE: 1956/05/29 60 y.o.  Admit date: 03/06/2016 Discharge date: 03/07/2016  Admission Diagnoses: Amputation of finger Past Medical History:  Diagnosis Date  . Diabetes mellitus without complication (HCC)   . Hypercholesteremia   . 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 female who was admitted 03/06/2016 with a chief complaint of Chief Complaint  Patient presents with  . Hand Injury  , and found to have a diagnosis of Amputation of finger.  They were brought to the operating room on 03/06/2016 and underwent Procedure(s): LEFT INDEX FINGER AND LONG FINGER REVISION OF AMPUTATION.    They were given perioperative antibiotics: Anti-infectives    Start     Dose/Rate Route Frequency Ordered Stop   03/07/16 0800  ceFAZolin (ANCEF) IVPB 2g/100 mL premix     2 g 200 mL/hr over 30 Minutes Intravenous To ShortStay Surgical 03/06/16 2005 03/07/16 0830   03/07/16 0100  ceFAZolin (ANCEF) IVPB 1 g/50 mL premix     1 g 100 mL/hr over 30 Minutes Intravenous Every 8 hours 03/06/16 2005     03/06/16 2015  ceFAZolin (ANCEF) IVPB 1 g/50 mL premix     1 g 100 mL/hr over 30 Minutes Intravenous NOW 03/06/16 2005 03/06/16 2131   03/06/16 1545  gentamicin (GARAMYCIN) 390 mg in dextrose 5 % 50 mL IVPB     390 mg 119.5 mL/hr over 30 Minutes Intravenous To Surgery 03/06/16 1423 03/06/16 1641   03/06/16 1400  ceFAZolin (ANCEF) IVPB 1 g/50 mL premix     1 g 100 mL/hr over 30 Minutes Intravenous  Once 03/06/16 1355 03/06/16 1523   03/06/16 0900  ceFAZolin (ANCEF) IVPB 1 g/50 mL premix     1 g 100 mL/hr over 30 Minutes Intravenous  Once 03/06/16 0850  03/06/16 0940   03/06/16 0000  cephALEXin (KEFLEX) 500 MG capsule     500 mg Oral 4 times daily 03/06/16 1653      .  They were given sequential compression devices, early ambulation, and Other (comment)ambulation for DVT prophylaxis.  Recent vital signs: Patient Vitals for the past 24 hrs:  BP Temp Temp src Pulse Resp SpO2  03/07/16 1348 (!) 113/43 98.3 F (36.8 C) Oral 72 18 97 %  03/07/16 0455 (!) 116/48 97.6 F (36.4 C) Oral (!) 59 19 97 %  03/07/16 0201 (!) 114/50 97.7 F (36.5 C) Oral 62 18 97 %  03/06/16 2008 (!) 119/52 97.6 F (36.4 C) Oral 66 14 98 %  03/06/16 1945 - - - 64 (!) 9 94 %  03/06/16 1937 (!) 117/57 - - 64 13 93 %  03/06/16 1930 - - - 64 16 94 %  03/06/16 1915 - - - 65 12 95 %  03/06/16 1908 120/63 - - 65 17 95 %  03/06/16 1900 - - - 66 14 100 %  03/06/16 1852 139/60 - - 68 16 100 %  03/06/16 1837 (!) 135/56 97.9 F (36.6 C) - 70 16 99 %  .  Recent laboratory studies: Dg Chest Portable 1 View  Result Date: 03/06/2016 CLINICAL DATA:  Preoperative respiratory evaluation. EXAM: PORTABLE CHEST 1 VIEW COMPARISON:  None. FINDINGS: The heart size  and mediastinal contours are within normal limits. Both lungs are clear. The visualized skeletal structures are unremarkable. IMPRESSION: No active disease. Electronically Signed   By: Kennith Center M.D.   On: 03/06/2016 14:39  Dg Hand Complete Left  Result Date: 03/06/2016 CLINICAL DATA:  Injury. EXAM: LEFT HAND - COMPLETE 3+ VIEW COMPARISON:  No prior. FINDINGS: Amputation of the left second and third digits noted. Amputation is at the level of the metacarpal phalangeal joints. Comminuted fracture fragments are noted. Tiny metallic soft tissue foreign bodies cannot be exclude. Diffuse degenerative change. IMPRESSION: Amputation of the left second and third digits as described above. Electronically Signed   By: Maisie Fus  Register   On: 03/06/2016 09:39   Discharge Medications:     Medication List    TAKE these  medications   cephALEXin 500 MG capsule Commonly known as:  KEFLEX Take 1 capsule (500 mg total) by mouth 4 (four) times daily.   docusate sodium 100 MG capsule Commonly known as:  COLACE Take 1 capsule (100 mg total) by mouth 2 (two) times daily.   enalapril 2.5 MG tablet Commonly known as:  VASOTEC Take 2.5 mg by mouth daily.   metFORMIN 1000 MG tablet Commonly known as:  GLUCOPHAGE Take 1,000 mg by mouth 2 (two) times daily with a meal.   oxyCODONE-acetaminophen 5-325 MG tablet Commonly known as:  ROXICET Take 1 tablet by mouth every 4 (four) hours as needed for severe pain.   simvastatin 80 MG tablet Commonly known as:  ZOCOR Take 80 mg by mouth daily.       Diagnostic Studies: Dg Chest Portable 1 View  Result Date: 03/06/2016 CLINICAL DATA:  Preoperative respiratory evaluation. EXAM: PORTABLE CHEST 1 VIEW COMPARISON:  None. FINDINGS: The heart size and mediastinal contours are within normal limits. Both lungs are clear. The visualized skeletal structures are unremarkable. IMPRESSION: No active disease. Electronically Signed   By: Kennith Center M.D.   On: 03/06/2016 14:39  Dg Hand Complete Left  Result Date: 03/06/2016 CLINICAL DATA:  Injury. EXAM: LEFT HAND - COMPLETE 3+ VIEW COMPARISON:  No prior. FINDINGS: Amputation of the left second and third digits noted. Amputation is at the level of the metacarpal phalangeal joints. Comminuted fracture fragments are noted. Tiny metallic soft tissue foreign bodies cannot be exclude. Diffuse degenerative change. IMPRESSION: Amputation of the left second and third digits as described above. Electronically Signed   By: Maisie Fus  Register   On: 03/06/2016 09:39   They benefited maximally from their hospital stay and there were no complications.     Disposition: Final discharge disposition not confirmed  Follow-up Information    Sharma Covert, MD.   Specialty:  Orthopedic Surgery Contact information: [address] [place] [address] [phone]            Signed: Sharma Covert 03/07/2016, 5:32 PM

## 2016-03-14 NOTE — Anesthesia Postprocedure Evaluation (Signed)
Anesthesia Post Note  Patient: Mary Tran  Procedure(s) Performed: Procedure(s) (LRB): LEFT INDEX FINGER AND LONG FINGER REVISION OF AMPUTATION (Left)  Patient location during evaluation: PACU Anesthesia Type: General Level of consciousness: awake and alert Pain management: pain level controlled Vital Signs Assessment: post-procedure vital signs reviewed and stable Respiratory status: spontaneous breathing, nonlabored ventilation, respiratory function stable and patient connected to nasal cannula oxygen Cardiovascular status: blood pressure returned to baseline and stable Postop Assessment: no signs of nausea or vomiting Anesthetic complications: no    Last Vitals:  Vitals:   03/07/16 0455 03/07/16 1348  BP: (!) 116/48 (!) 113/43  Pulse: (!) 59 72  Resp: 19 18  Temp: 36.4 C 36.8 C    Last Pain:  Vitals:   03/07/16 1742  TempSrc:   PainSc: 5                  Kaylib Furness,JAMES TERRILL

## 2017-04-15 IMAGING — CR DG HAND COMPLETE 3+V*L*
3 series · 3 of 3 positions shown · non-contrast
Comparison: No prior.

CLINICAL DATA: Injury.

EXAM:
LEFT HAND - COMPLETE 3+ VIEW

[x hand pa left *]
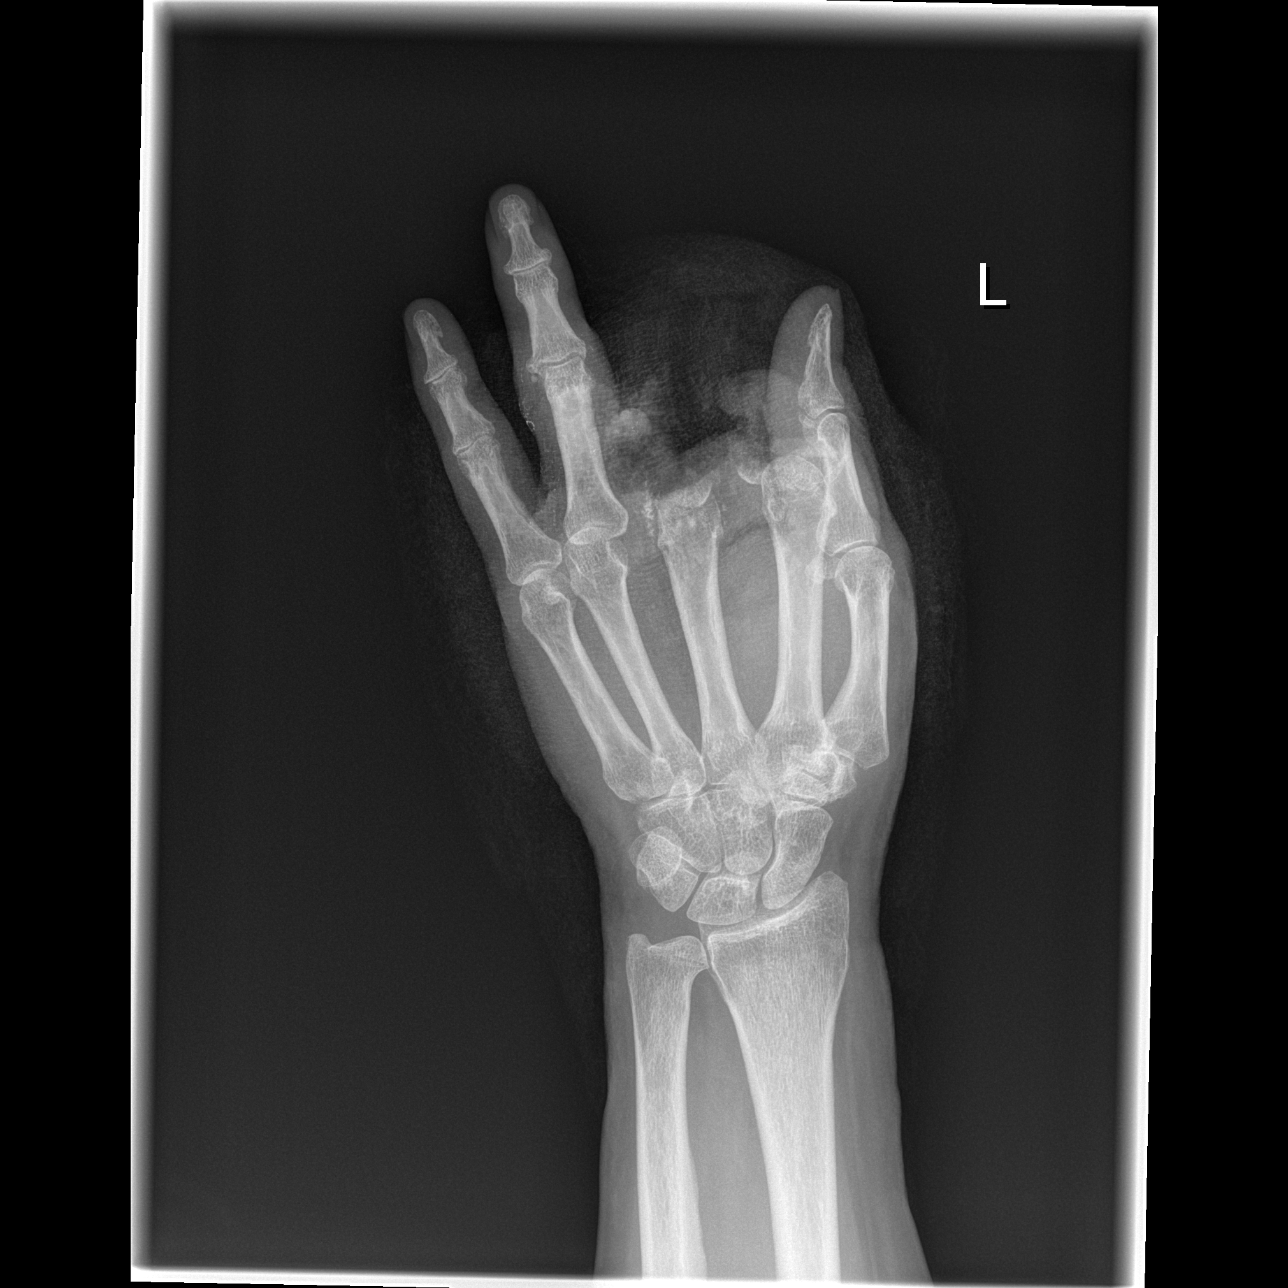

[x hand oblique left]
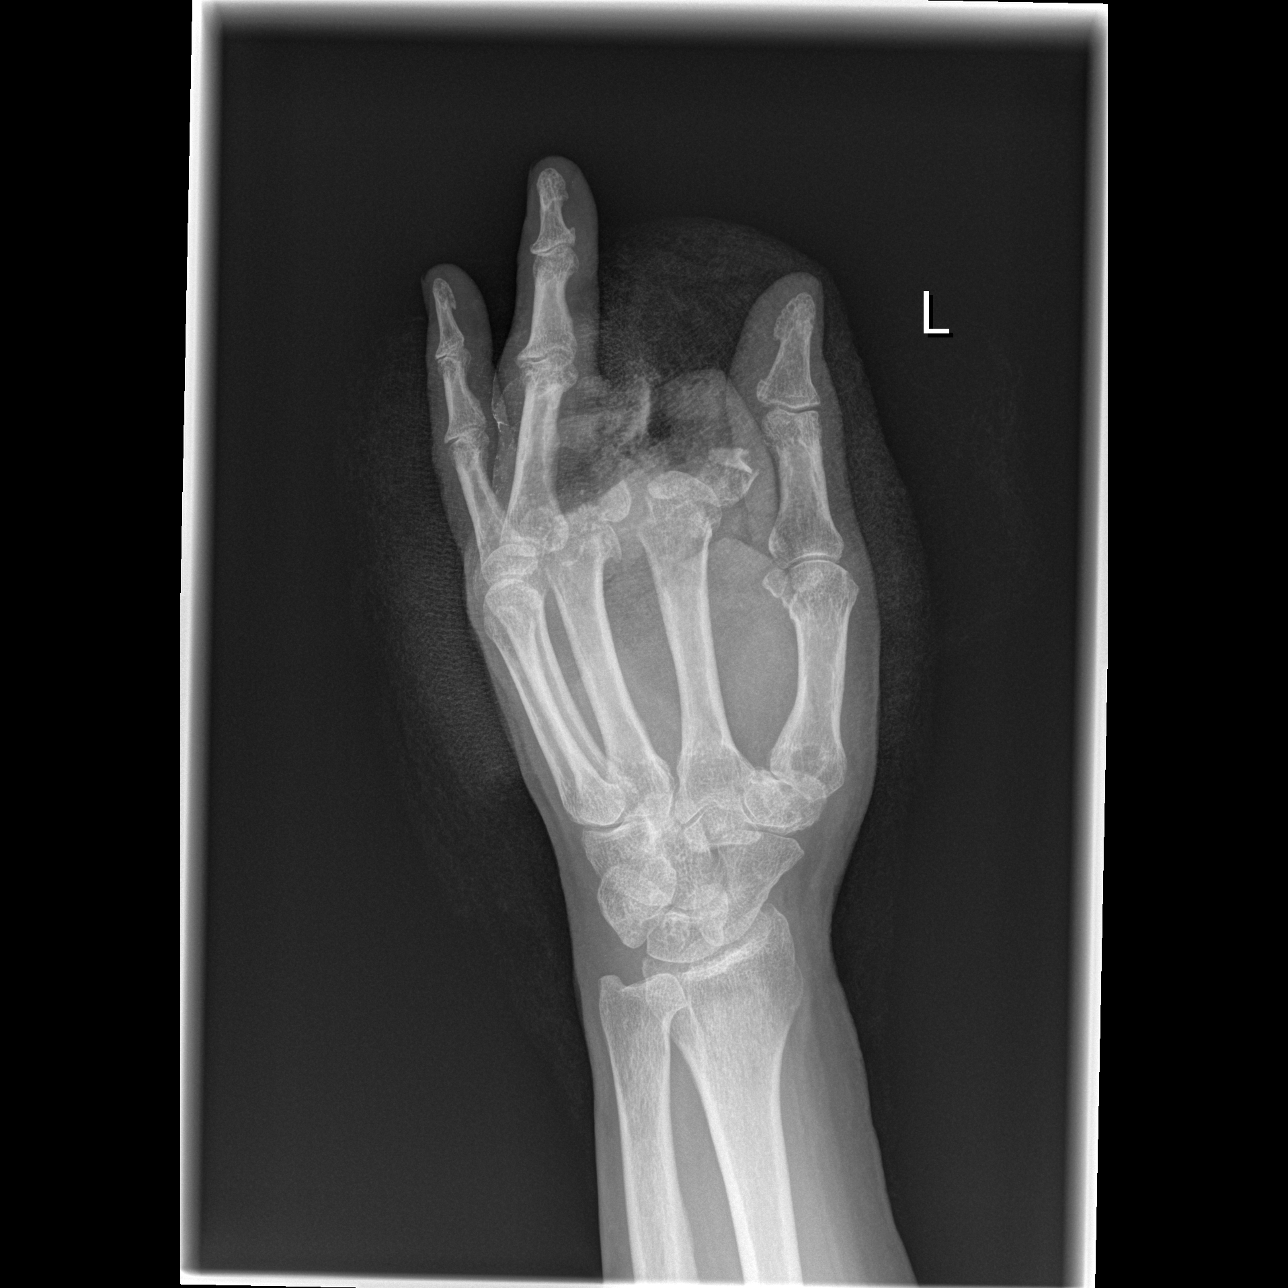

[x hand lat left]
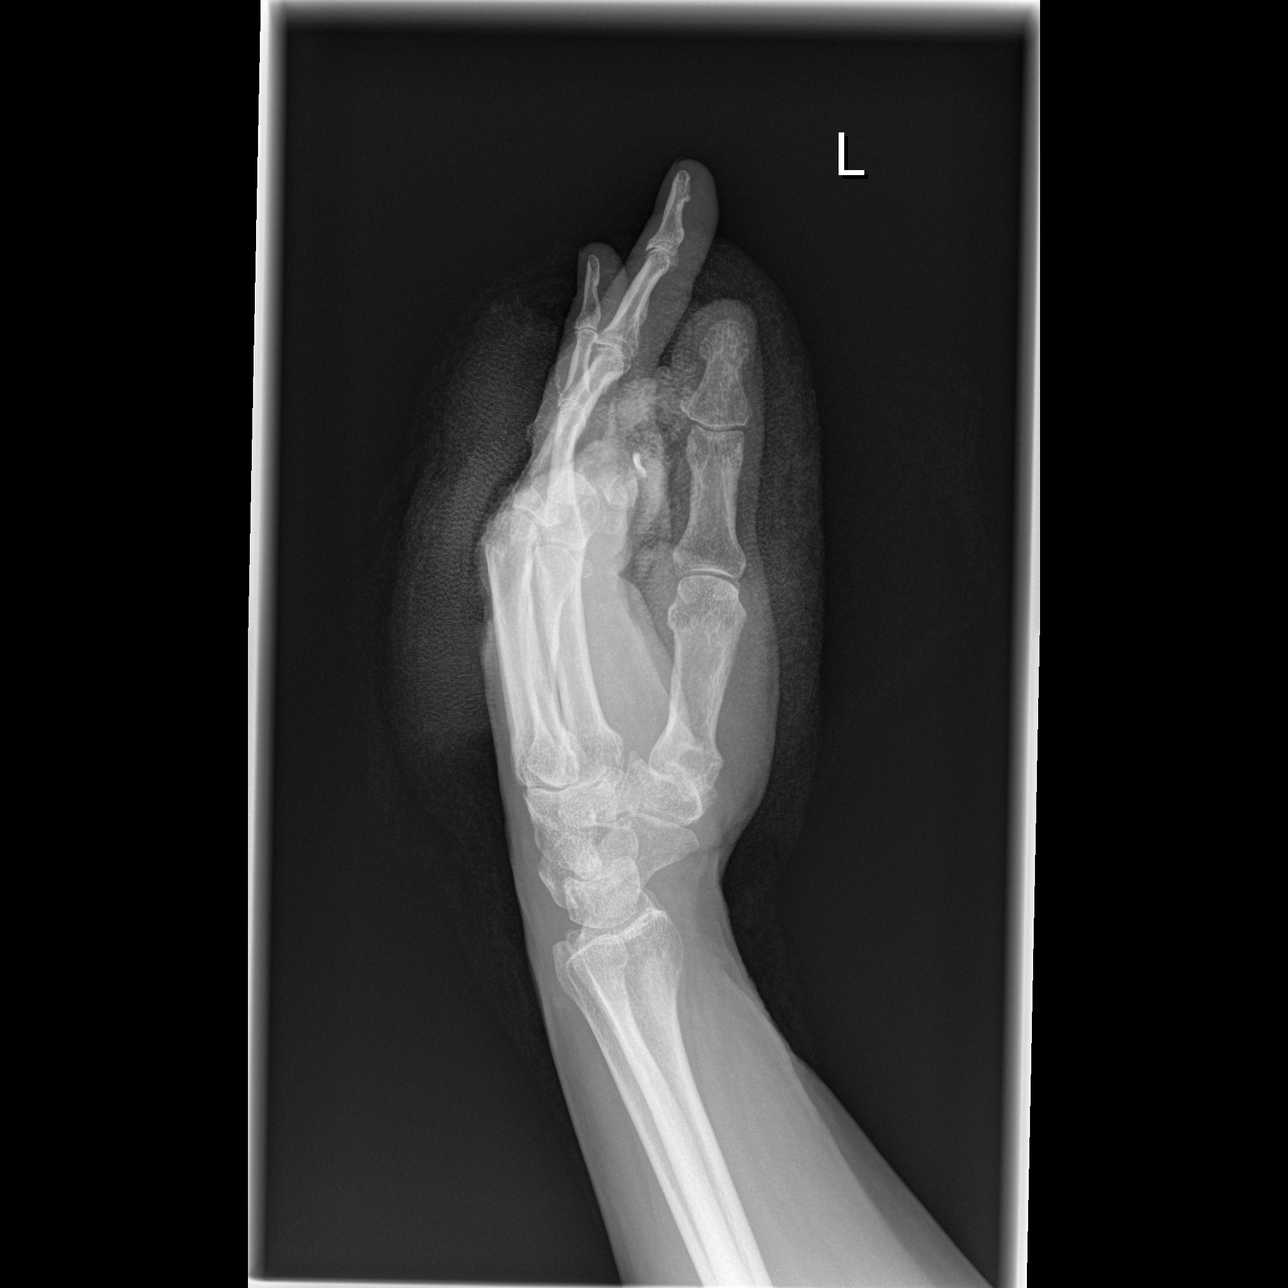

[3 of 3 positions shown; findings below may reference images not displayed]

FINDINGS: Amputation of the left second and third digits noted. Amputation is
at the level of the metacarpal phalangeal joints. Comminuted
fracture fragments are noted. Tiny metallic soft tissue foreign
bodies cannot be exclude. Diffuse degenerative change.
IMPRESSION: Amputation of the left second and third digits as described above.
